# Patient Record
Sex: Female | Born: 1945 | Race: Black or African American | Hispanic: No | Marital: Married | Smoking: Former smoker
Health system: Southern US, Community
[De-identification: ages and names within clinical notes are randomized; demographics above are authoritative.]

## PROBLEM LIST (undated history)

## (undated) DIAGNOSIS — E119 Type 2 diabetes mellitus without complications: Secondary | ICD-10-CM

## (undated) DIAGNOSIS — R011 Cardiac murmur, unspecified: Secondary | ICD-10-CM

## (undated) DIAGNOSIS — I10 Essential (primary) hypertension: Secondary | ICD-10-CM

## (undated) DIAGNOSIS — G2581 Restless legs syndrome: Secondary | ICD-10-CM

## (undated) DIAGNOSIS — Z972 Presence of dental prosthetic device (complete) (partial): Secondary | ICD-10-CM

## (undated) DIAGNOSIS — M109 Gout, unspecified: Secondary | ICD-10-CM

## (undated) HISTORY — PX: BACK SURGERY: SHX140

## (undated) HISTORY — PX: JOINT REPLACEMENT: SHX530

## (undated) HISTORY — PX: CHOLECYSTECTOMY: SHX55

---

## 2004-05-30 ENCOUNTER — Other Ambulatory Visit: Payer: Self-pay

## 2004-11-28 ENCOUNTER — Ambulatory Visit: Payer: Self-pay | Admitting: Internal Medicine

## 2005-02-08 ENCOUNTER — Ambulatory Visit: Payer: Self-pay

## 2005-03-29 ENCOUNTER — Ambulatory Visit: Payer: Self-pay

## 2005-11-08 ENCOUNTER — Inpatient Hospital Stay: Payer: Self-pay | Admitting: Unknown Physician Specialty

## 2006-07-01 ENCOUNTER — Ambulatory Visit: Payer: Self-pay | Admitting: Internal Medicine

## 2007-01-04 IMAGING — CR DG CHEST 2V
1 series · 2 of 2 positions shown · non-contrast
Comparison: none

REASON FOR EXAM: htn
COMMENTS:

[Series 2372: postero_anterior · 0.11mm/px · 2 of 2 slices shown]
[im 1/2]
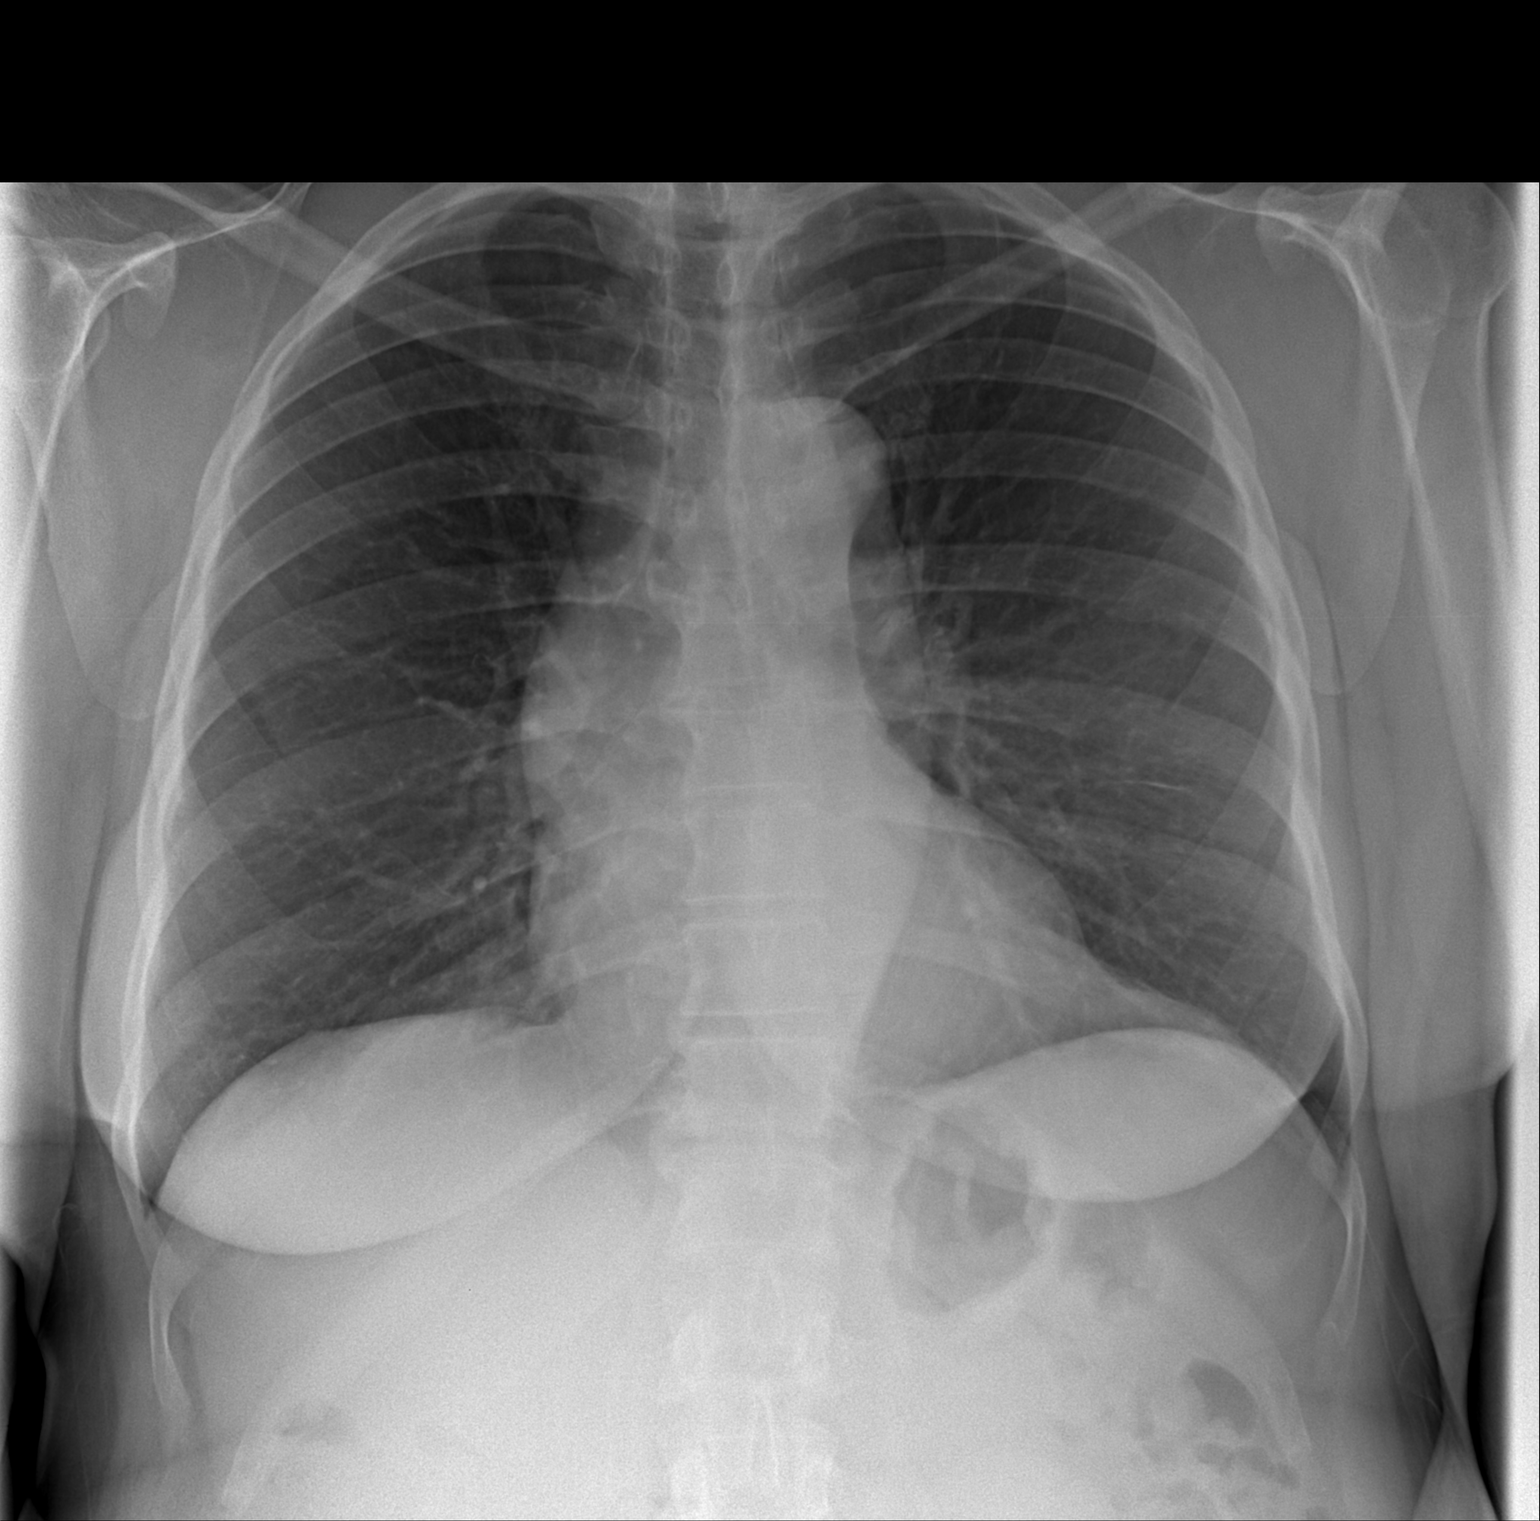
[im 2/2]
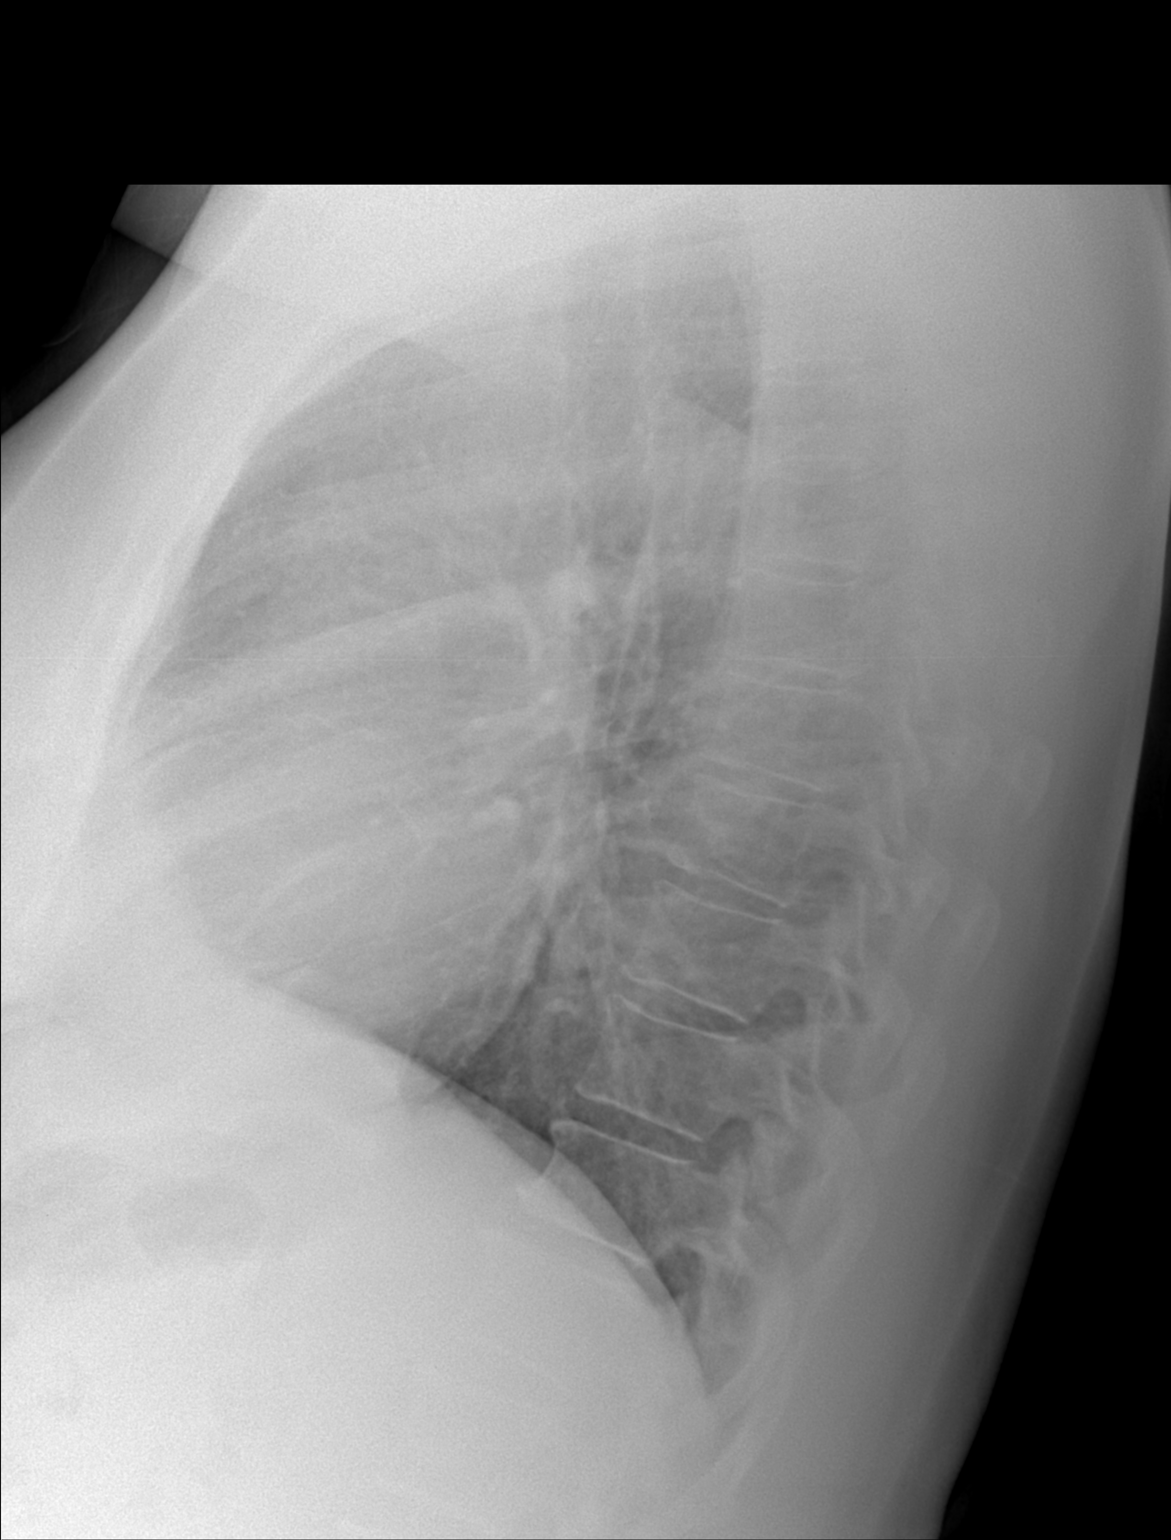

[2 of 2 positions shown; findings below may reference images not displayed]

PROCEDURE:     DXR - DXR CHEST PA (OR AP) AND LATERAL  - October 29, 2005  [DATE]

RESULT:     Comparison is made with PA and lateral chest of 05/30/2004.

The lungs are adequately inflated.  There is no focal infiltrate.  The heart
is not enlarged.  There is tortuosity of the descending and ascending
thoracic aorta.  There is no pleural effusion.
IMPRESSION: I do not see evidence of acute cardiopulmonary disease.

## 2007-08-06 ENCOUNTER — Ambulatory Visit: Payer: Self-pay | Admitting: Internal Medicine

## 2008-08-09 ENCOUNTER — Ambulatory Visit: Payer: Self-pay | Admitting: Internal Medicine

## 2009-09-22 ENCOUNTER — Ambulatory Visit: Payer: Self-pay | Admitting: Internal Medicine

## 2011-01-18 ENCOUNTER — Ambulatory Visit: Payer: Self-pay | Admitting: Internal Medicine

## 2011-01-29 ENCOUNTER — Ambulatory Visit: Payer: Self-pay | Admitting: Internal Medicine

## 2011-04-10 ENCOUNTER — Ambulatory Visit: Payer: Self-pay | Admitting: Internal Medicine

## 2011-05-22 ENCOUNTER — Ambulatory Visit: Payer: Self-pay | Admitting: Unknown Physician Specialty

## 2011-05-22 DIAGNOSIS — I10 Essential (primary) hypertension: Secondary | ICD-10-CM

## 2011-05-29 ENCOUNTER — Inpatient Hospital Stay: Payer: Self-pay | Admitting: Unknown Physician Specialty

## 2011-06-11 ENCOUNTER — Encounter: Payer: Self-pay | Admitting: Unknown Physician Specialty

## 2011-07-11 ENCOUNTER — Encounter: Payer: Self-pay | Admitting: Unknown Physician Specialty

## 2011-08-11 ENCOUNTER — Encounter: Payer: Self-pay | Admitting: Unknown Physician Specialty

## 2011-11-07 ENCOUNTER — Inpatient Hospital Stay: Payer: Self-pay | Admitting: Surgery

## 2012-07-27 IMAGING — CR DG CHEST 2V
1 series · 2 of 2 positions shown · non-contrast
Comparison: none

REASON FOR EXAM: HTN
COMMENTS:

[Series 1: view not recorded · 0.17mm/px · 2 of 2 slices shown]
[im 1/2]
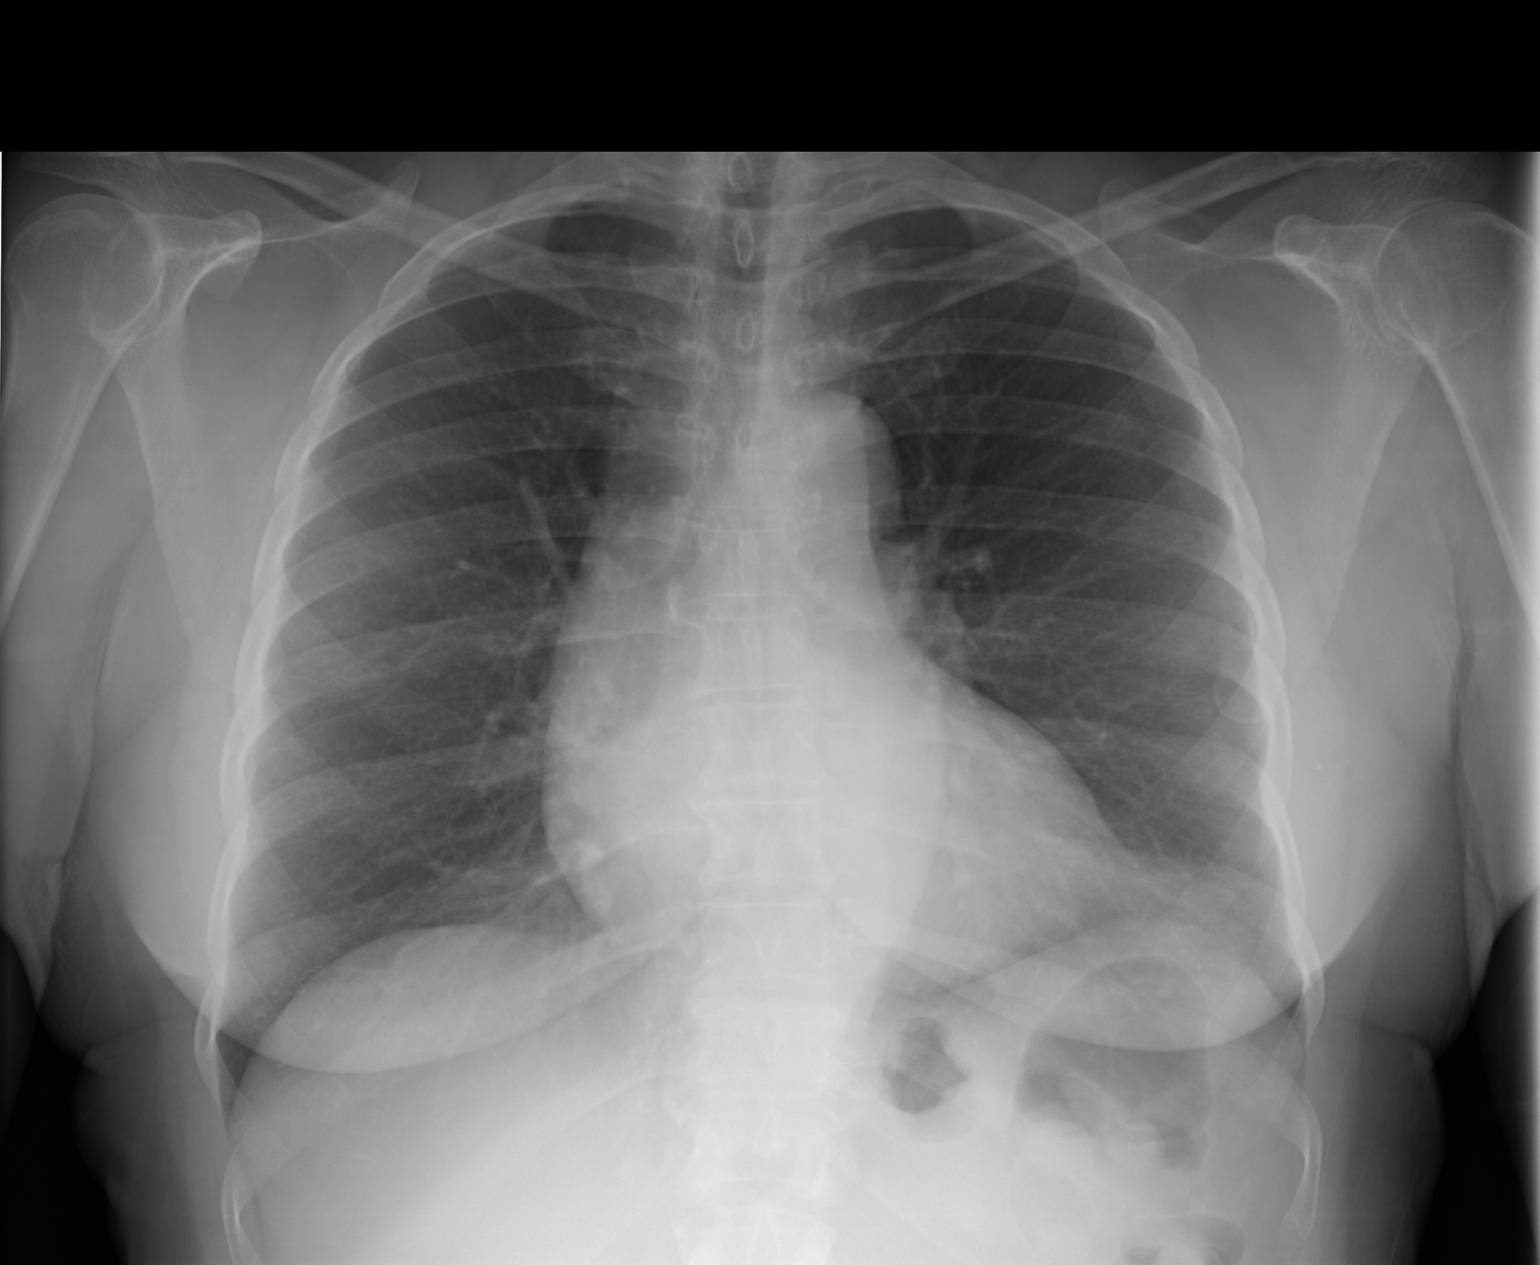
[im 2/2]
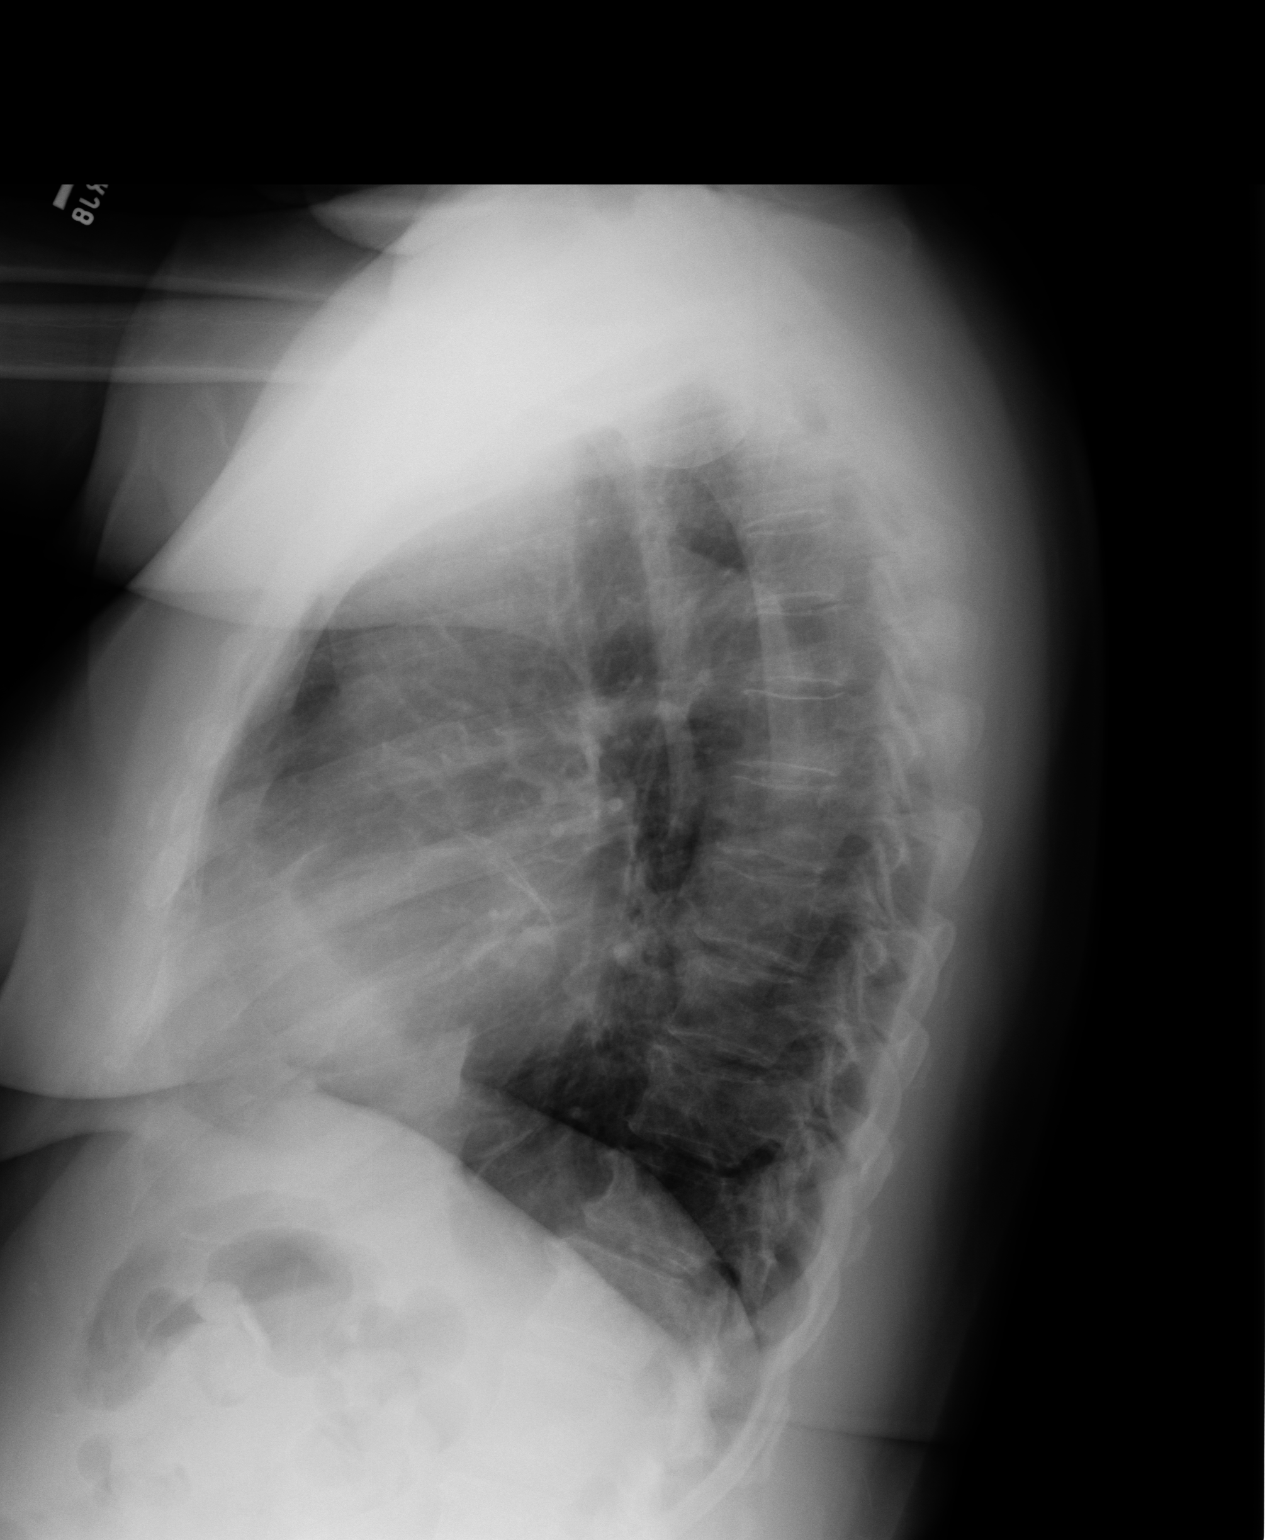

[2 of 2 positions shown; findings below may reference images not displayed]

PROCEDURE:     DXR - DXR CHEST PA (OR AP) AND LATERAL  - May 22, 2011 [DATE]

RESULT:     There is no previous exam for comparison.

The lungs are clear. The heart and pulmonary vessels are normal. The bony
and mediastinal structures are unremarkable. There is no effusion. There is
no pneumothorax or evidence of congestive failure.
IMPRESSION: No acute cardiopulmonary disease.

## 2012-08-19 ENCOUNTER — Ambulatory Visit: Payer: Self-pay | Admitting: Internal Medicine

## 2012-08-21 ENCOUNTER — Ambulatory Visit: Payer: Self-pay | Admitting: Internal Medicine

## 2013-02-16 ENCOUNTER — Ambulatory Visit: Payer: Self-pay | Admitting: Internal Medicine

## 2013-08-20 ENCOUNTER — Ambulatory Visit: Payer: Self-pay | Admitting: Internal Medicine

## 2015-02-03 ENCOUNTER — Ambulatory Visit: Payer: Self-pay | Admitting: Internal Medicine

## 2015-10-27 ENCOUNTER — Encounter: Payer: Self-pay | Admitting: *Deleted

## 2015-11-01 ENCOUNTER — Ambulatory Visit
Admission: RE | Admit: 2015-11-01 | Discharge: 2015-11-01 | Disposition: A | Discharge status: Home / Self Care | Payer: Commercial Managed Care - HMO | Source: Ambulatory Visit | Attending: Ophthalmology | Admitting: Ophthalmology

## 2015-11-01 ENCOUNTER — Ambulatory Visit: Payer: Commercial Managed Care - HMO | Admitting: Certified Registered"

## 2015-11-01 ENCOUNTER — Encounter
Admission: RE | Disposition: A | Discharge status: Home / Self Care | Payer: Self-pay | Source: Ambulatory Visit | Attending: Ophthalmology

## 2015-11-01 DIAGNOSIS — Z96653 Presence of artificial knee joint, bilateral: Secondary | ICD-10-CM | POA: Insufficient documentation

## 2015-11-01 DIAGNOSIS — Z87891 Personal history of nicotine dependence: Secondary | ICD-10-CM | POA: Insufficient documentation

## 2015-11-01 DIAGNOSIS — Z9049 Acquired absence of other specified parts of digestive tract: Secondary | ICD-10-CM | POA: Insufficient documentation

## 2015-11-01 DIAGNOSIS — H2512 Age-related nuclear cataract, left eye: Secondary | ICD-10-CM | POA: Diagnosis present

## 2015-11-01 DIAGNOSIS — E119 Type 2 diabetes mellitus without complications: Secondary | ICD-10-CM | POA: Diagnosis not present

## 2015-11-01 DIAGNOSIS — I10 Essential (primary) hypertension: Secondary | ICD-10-CM | POA: Insufficient documentation

## 2015-11-01 DIAGNOSIS — R011 Cardiac murmur, unspecified: Secondary | ICD-10-CM | POA: Diagnosis not present

## 2015-11-01 DIAGNOSIS — G2581 Restless legs syndrome: Secondary | ICD-10-CM | POA: Diagnosis not present

## 2015-11-01 HISTORY — DX: Type 2 diabetes mellitus without complications: E11.9

## 2015-11-01 HISTORY — DX: Cardiac murmur, unspecified: R01.1

## 2015-11-01 HISTORY — DX: Essential (primary) hypertension: I10

## 2015-11-01 HISTORY — DX: Restless legs syndrome: G25.81

## 2015-11-01 HISTORY — PX: CATARACT EXTRACTION W/PHACO: SHX586

## 2015-11-01 LAB — GLUCOSE, CAPILLARY: GLUCOSE-CAPILLARY: 114 mg/dL — AB (ref 65–99)

## 2015-11-01 SURGERY — PHACOEMULSIFICATION, CATARACT, WITH IOL INSERTION
Anesthesia: Monitor Anesthesia Care | Site: Eye | Laterality: Left | Wound class: Clean

## 2015-11-01 MED ORDER — CARBACHOL 0.01 % IO SOLN
INTRAOCULAR | Status: DC | PRN
  Administered 2015-11-01: .5 mL via INTRAOCULAR

## 2015-11-01 MED ORDER — TETRACAINE HCL 0.5 % OP SOLN
OPHTHALMIC | Status: AC
  Administered 2015-11-01: 1 [drp] via OPHTHALMIC
  Filled 2015-11-01: qty 2

## 2015-11-01 MED ORDER — NA CHONDROIT SULF-NA HYALURON 40-17 MG/ML IO SOLN
INTRAOCULAR | Status: DC | PRN
  Administered 2015-11-01: .5 mL via INTRAOCULAR

## 2015-11-01 MED ORDER — CEFUROXIME OPHTHALMIC INJECTION 1 MG/0.1 ML
INJECTION | OPHTHALMIC | Status: DC | PRN
  Administered 2015-11-01: .1 mL via INTRACAMERAL

## 2015-11-01 MED ORDER — POVIDONE-IODINE 5 % OP SOLN
OPHTHALMIC | Status: AC
  Administered 2015-11-01: 1 via OPHTHALMIC
  Filled 2015-11-01: qty 30

## 2015-11-01 MED ORDER — TETRACAINE HCL 0.5 % OP SOLN
1.0000 [drp] | OPHTHALMIC | Status: AC | PRN
  Administered 2015-11-01: 1 [drp] via OPHTHALMIC

## 2015-11-01 MED ORDER — NA CHONDROIT SULF-NA HYALURON 40-17 MG/ML IO SOLN
INTRAOCULAR | Status: AC
  Filled 2015-11-01: qty 1

## 2015-11-01 MED ORDER — ARMC OPHTHALMIC DILATING GEL
OPHTHALMIC | Status: AC
  Administered 2015-11-01: 1 via OPHTHALMIC
  Filled 2015-11-01: qty 0.25

## 2015-11-01 MED ORDER — EPINEPHRINE HCL 1 MG/ML IJ SOLN
INTRAMUSCULAR | Status: AC
  Filled 2015-11-01: qty 1

## 2015-11-01 MED ORDER — BSS IO SOLN
INTRAOCULAR | Status: DC | PRN
  Administered 2015-11-01: 1 mL via OPHTHALMIC

## 2015-11-01 MED ORDER — MOXIFLOXACIN HCL 0.5 % OP SOLN
OPHTHALMIC | Status: DC | PRN
  Administered 2015-11-01: 1 [drp] via OPHTHALMIC

## 2015-11-01 MED ORDER — SODIUM CHLORIDE 0.9 % IV SOLN
INTRAVENOUS | Status: DC
  Administered 2015-11-01: 06:00:00 via INTRAVENOUS

## 2015-11-01 MED ORDER — CEFUROXIME OPHTHALMIC INJECTION 1 MG/0.1 ML
INJECTION | OPHTHALMIC | Status: AC
  Filled 2015-11-01: qty 0.1

## 2015-11-01 MED ORDER — MIDAZOLAM HCL 2 MG/2ML IJ SOLN
INTRAMUSCULAR | Status: DC | PRN
  Administered 2015-11-01: 2 mg via INTRAVENOUS

## 2015-11-01 MED ORDER — ARMC OPHTHALMIC DILATING GEL
1.0000 "application " | OPHTHALMIC | Status: AC | PRN
  Administered 2015-11-01 (×2): 1 via OPHTHALMIC

## 2015-11-01 MED ORDER — MOXIFLOXACIN HCL 0.5 % OP SOLN
OPHTHALMIC | Status: AC
  Filled 2015-11-01: qty 3

## 2015-11-01 MED ORDER — POVIDONE-IODINE 5 % OP SOLN
1.0000 "application " | OPHTHALMIC | Status: AC | PRN
  Administered 2015-11-01: 1 via OPHTHALMIC

## 2015-11-01 MED ORDER — MOXIFLOXACIN HCL 0.5 % OP SOLN: 1.0000 [drp] | OPHTHALMIC | Status: DC | PRN

## 2015-11-01 SURGICAL SUPPLY — 22 items
CANNULA ANT/CHMB 27GA (MISCELLANEOUS) ×2 IMPLANT
CUP MEDICINE 2OZ PLAST GRAD ST (MISCELLANEOUS) ×2 IMPLANT
GLOVE BIO SURGEON STRL SZ8 (GLOVE) ×2 IMPLANT
GLOVE BIOGEL M 6.5 STRL (GLOVE) ×2 IMPLANT
GLOVE SURG LX 8.0 MICRO (GLOVE) ×1
GLOVE SURG LX STRL 8.0 MICRO (GLOVE) ×1 IMPLANT
GOWN STRL REUS W/ TWL LRG LVL3 (GOWN DISPOSABLE) ×2 IMPLANT
GOWN STRL REUS W/TWL LRG LVL3 (GOWN DISPOSABLE) ×2
LENS IOL TECNIS 20.0 (Intraocular Lens) ×2 IMPLANT
LENS IOL TECNIS MONO 1P 20.0 (Intraocular Lens) ×1 IMPLANT
PACK CATARACT (MISCELLANEOUS) ×2 IMPLANT
PACK CATARACT BRASINGTON LX (MISCELLANEOUS) ×2 IMPLANT
PACK EYE AFTER SURG (MISCELLANEOUS) ×2 IMPLANT
SOL BSS BAG (MISCELLANEOUS) ×2
SOL PREP PVP 2OZ (MISCELLANEOUS) ×2
SOLUTION BSS BAG (MISCELLANEOUS) ×1 IMPLANT
SOLUTION PREP PVP 2OZ (MISCELLANEOUS) ×1 IMPLANT
SYR 3ML LL SCALE MARK (SYRINGE) ×2 IMPLANT
SYR 5ML LL (SYRINGE) ×2 IMPLANT
SYR TB 1ML 27GX1/2 LL (SYRINGE) ×2 IMPLANT
WATER STERILE IRR 1000ML POUR (IV SOLUTION) ×2 IMPLANT
WIPE NON LINTING 3.25X3.25 (MISCELLANEOUS) ×2 IMPLANT

## 2015-11-01 NOTE — Discharge Instructions (Signed)
AMBULATORY SURGERY  °DISCHARGE INSTRUCTIONS ° ° °1) The drugs that you were given will stay in your system until tomorrow so for the next 24 hours you should not: ° °A) Drive an automobile °B) Make any legal decisions °C) Drink any alcoholic beverage ° ° °2) You may resume regular meals tomorrow.  Today it is better to start with liquids and gradually work up to solid foods. ° °You may eat anything you prefer, but it is better to start with liquids, then soup and crackers, and gradually work up to solid foods. ° ° °3) Please notify your doctor immediately if you have any unusual bleeding, trouble breathing, redness and pain at the surgery site, drainage, fever, or pain not relieved by medication. ° ° ° °4) Additional Instructions: ° ° ° °Eye Surgery Discharge Instructions ° °Expect mild scratchy sensation or mild soreness. °DO NOT RUB YOUR EYE! ° °The day of surgery: °• Minimal physical activity, but bed rest is not required °• No reading, computer work, or close hand work °• No bending, lifting, or straining. °• May watch TV ° °For 24 hours: °• No driving, legal decisions, or alcoholic beverages °• Safety precautions °• Eat anything you prefer: It is better to start with liquids, then soup then solid foods. °• _____ Eye patch should be worn until postoperative exam tomorrow. °• ____ Solar shield eyeglasses should be worn for comfort in the sunlight/patch while sleeping ° °Resume all regular medications including aspirin or Coumadin if these were discontinued prior to surgery. °You may shower, bathe, shave, or wash your hair. °Tylenol may be taken for mild discomfort. ° °Call your doctor if you experience significant pain, nausea, or vomiting, fever > 101 or other signs of infection. [phone] or [phone] °Specific instructions: ° ° ° ° ° °Please contact your physician with any problems or Same Day Surgery at [phone], Monday through Friday 6 am to 4 pm, or [place] [facility] number at  [phone]. °

## 2015-11-01 NOTE — Transfer of Care (Signed)
Immediate Anesthesia Transfer of Care Note  Patient: Alicia Tate  Procedure(s) Performed: Procedure(s) with comments: CATARACT EXTRACTION PHACO AND INTRAOCULAR LENS PLACEMENT (IOC) (Left) - US 01:26 AP% 16.5 CDE 14.22 fluid pack lot #[id number]  Patient Location: Short Stay  Anesthesia Type:MAC  Level of Consciousness: awake  Airway & Oxygen Therapy: Patient Spontanous Breathing  Post-op Assessment: Report given to RN  Post vital signs: Reviewed  Last Vitals:  Filed Vitals:   11/01/15 0630 11/01/15 0748  BP: 155/88 122/88  Pulse:  59  Temp:  36.6 C  Resp:      Complications: No apparent anesthesia complications

## 2015-11-01 NOTE — Anesthesia Postprocedure Evaluation (Signed)
Anesthesia Post Note  Patient: Alicia Tate  Procedure(s) Performed: Procedure(s) (LRB): CATARACT EXTRACTION PHACO AND INTRAOCULAR LENS PLACEMENT (IOC) (Left)  Patient location during evaluation: Short Stay Anesthesia Type: MAC Level of consciousness: awake Pain management: pain level controlled Vital Signs Assessment: post-procedure vital signs reviewed and stable Respiratory status: spontaneous breathing Cardiovascular status: stable Postop Assessment: No headache Anesthetic complications: no    Last Vitals:  Filed Vitals:   11/01/15 0630 11/01/15 0748  BP: 155/88 122/88  Pulse:  59  Temp:  36.6 C  Resp:      Last Pain: There were no vitals filed for this visit.               Mathews ArgyleLogan,  Bralen Wiltgen P

## 2015-11-01 NOTE — Anesthesia Preprocedure Evaluation (Signed)
Anesthesia Evaluation  Patient identified by MRN, date of birth, ID band Patient awake    Reviewed: Allergy & Precautions, H&P , NPO status , Patient's Chart, lab work & pertinent test results, reviewed documented beta blocker date and time   Airway Mallampati: II  TM Distance: >3 FB Neck ROM: full    Dental no notable dental hx.    Pulmonary neg pulmonary ROS, former smoker,    Pulmonary exam normal breath sounds clear to auscultation       Cardiovascular Exercise Tolerance: Good hypertension, negative cardio ROS  + Valvular Problems/Murmurs  Rhythm:regular Rate:Normal     Neuro/Psych negative neurological ROS  negative psych ROS   GI/Hepatic negative GI ROS, Neg liver ROS,   Endo/Other  negative endocrine ROSdiabetes  Renal/GU negative Renal ROS  negative genitourinary   Musculoskeletal   Abdominal   Peds  Hematology negative hematology ROS (+)   Anesthesia Other Findings   Reproductive/Obstetrics negative OB ROS                             Anesthesia Physical Anesthesia Plan  ASA: III  Anesthesia Plan: MAC   Post-op Pain Management:    Induction:   Airway Management Planned:   Additional Equipment:   Intra-op Plan:   Post-operative Plan:   Informed Consent: I have reviewed the patients History and Physical, chart, labs and discussed the procedure including the risks, benefits and alternatives for the proposed anesthesia with the patient or authorized representative who has indicated his/her understanding and acceptance.   Dental Advisory Given  Plan Discussed with: CRNA  Anesthesia Plan Comments:         Anesthesia Quick Evaluation

## 2015-11-01 NOTE — Op Note (Signed)
PREOPERATIVE DIAGNOSIS:  Nuclear sclerotic cataract of the left eye.   POSTOPERATIVE DIAGNOSIS:  nuclear sclerotic cataract left eye   OPERATIVE PROCEDURE:  Procedure(s): CATARACT EXTRACTION PHACO AND INTRAOCULAR LENS PLACEMENT (IOC)   SURGEON:  Galen ManilaWilliam Ranon Coven, MD.   ANESTHESIA:   Anesthesiologist: Yevette EdwardsJames G Adams, MD CRNA: Mathews ArgyleBenjamin Logan, CRNA  1.      Managed anesthesia care. 2.      Topical tetracaine drops followed by 2% Xylocaine jelly applied in the preoperative holding area.   COMPLICATIONS:  None.   TECHNIQUE:   Stop and chop   DESCRIPTION OF PROCEDURE:  The patient was examined and consented in the preoperative holding area where the aforementioned topical anesthesia was applied to the left eye and then brought back to the Operating Room where the left eye was prepped and draped in the usual sterile ophthalmic fashion and a lid speculum was placed. A paracentesis was created with the side port blade and the anterior chamber was filled with viscoelastic. A near clear corneal incision was performed with the steel keratome. A continuous curvilinear capsulorrhexis was performed with a cystotome followed by the capsulorrhexis forceps. Hydrodissection and hydrodelineation were carried out with BSS on a blunt cannula. The lens was removed in a stop and chop  technique and the remaining cortical material was removed with the irrigation-aspiration handpiece. The capsular bag was inflated with viscoelastic and the Technis ZCB00 lens was placed in the capsular bag without complication. The remaining viscoelastic was removed from the eye with the irrigation-aspiration handpiece. The wounds were hydrated. The anterior chamber was flushed with Miostat and the eye was inflated to physiologic pressure. 0.1 mL of cefuroxime concentration 10 mg/mL was placed in the anterior chamber. The wounds were found to be water tight. The eye was dressed with Vigamox. The patient was given protective glasses to wear  throughout the day and a shield with which to sleep tonight. The patient was also given drops with which to begin a drop regimen today and will follow-up with me in one day.  Implant Name Type Inv. Item Serial No. Manufacturer Lot No. LRB No. Used  LENS IMPL INTRAOC ZCB00 20.0 - [id number] Intraocular Lens LENS IMPL INTRAOC ZCB00 20.0 [id number] AMO   Left 1   Procedure(s) with comments: CATARACT EXTRACTION PHACO AND INTRAOCULAR LENS PLACEMENT (IOC) (Left) - US 01:26 AP% 16.5 CDE 14.22 fluid pack lot #[id number]  Electronically signed: Mahesh Sizemore LOUIS 11/01/2015 7:46 AM

## 2015-11-01 NOTE — H&P (Signed)
  All labs reviewed. Abnormal studies sent to patients PCP when indicated.  Previous H&P reviewed, patient examined, there are NO CHANGES.  Alicia Tate LOUIS11/22/20167:15 AM

## 2017-02-20 ENCOUNTER — Other Ambulatory Visit: Payer: Self-pay | Admitting: Internal Medicine

## 2017-02-20 DIAGNOSIS — Z1231 Encounter for screening mammogram for malignant neoplasm of breast: Secondary | ICD-10-CM

## 2017-03-26 ENCOUNTER — Ambulatory Visit
Admission: RE | Admit: 2017-03-26 | Discharge: 2017-03-26 | Disposition: A | Discharge status: Home / Self Care | Payer: Commercial Managed Care - HMO | Source: Ambulatory Visit | Attending: Internal Medicine | Admitting: Internal Medicine

## 2017-03-26 DIAGNOSIS — Z1231 Encounter for screening mammogram for malignant neoplasm of breast: Secondary | ICD-10-CM | POA: Diagnosis present

## 2018-06-02 ENCOUNTER — Other Ambulatory Visit: Payer: Self-pay | Admitting: Internal Medicine

## 2018-06-02 DIAGNOSIS — Z1231 Encounter for screening mammogram for malignant neoplasm of breast: Secondary | ICD-10-CM

## 2018-06-23 ENCOUNTER — Ambulatory Visit
Admission: RE | Admit: 2018-06-23 | Discharge: 2018-06-23 | Disposition: A | Discharge status: Home / Self Care | Payer: Medicare PPO | Source: Ambulatory Visit | Attending: Internal Medicine | Admitting: Internal Medicine

## 2018-06-23 DIAGNOSIS — Z1231 Encounter for screening mammogram for malignant neoplasm of breast: Secondary | ICD-10-CM | POA: Insufficient documentation

## 2018-12-30 ENCOUNTER — Other Ambulatory Visit: Payer: Self-pay

## 2018-12-30 ENCOUNTER — Emergency Department
Admission: EM | Admit: 2018-12-30 | Discharge: 2018-12-30 | Disposition: A | Discharge status: Home / Self Care | Payer: Medicare PPO | Attending: Emergency Medicine | Admitting: Emergency Medicine

## 2018-12-30 DIAGNOSIS — R358 Other polyuria: Secondary | ICD-10-CM | POA: Diagnosis present

## 2018-12-30 DIAGNOSIS — E1165 Type 2 diabetes mellitus with hyperglycemia: Secondary | ICD-10-CM | POA: Diagnosis not present

## 2018-12-30 DIAGNOSIS — E119 Type 2 diabetes mellitus without complications: Secondary | ICD-10-CM | POA: Diagnosis not present

## 2018-12-30 DIAGNOSIS — Z87891 Personal history of nicotine dependence: Secondary | ICD-10-CM | POA: Insufficient documentation

## 2018-12-30 DIAGNOSIS — R739 Hyperglycemia, unspecified: Secondary | ICD-10-CM

## 2018-12-30 DIAGNOSIS — Z7984 Long term (current) use of oral hypoglycemic drugs: Secondary | ICD-10-CM | POA: Insufficient documentation

## 2018-12-30 DIAGNOSIS — I10 Essential (primary) hypertension: Secondary | ICD-10-CM | POA: Insufficient documentation

## 2018-12-30 DIAGNOSIS — Z79899 Other long term (current) drug therapy: Secondary | ICD-10-CM | POA: Insufficient documentation

## 2018-12-30 LAB — URINALYSIS, COMPLETE (UACMP) WITH MICROSCOPIC
BACTERIA UA: NONE SEEN
BILIRUBIN URINE: NEGATIVE
Glucose, UA: >=500 mg/dL — AB
HGB URINE DIPSTICK: NEGATIVE
Ketones, ur: NEGATIVE mg/dL
NITRITE: NEGATIVE
PH: 5 (ref 5.0–8.0)
Protein, ur: NEGATIVE mg/dL
SPECIFIC GRAVITY, URINE: 1.031 — AB (ref 1.005–1.030)

## 2018-12-30 LAB — BASIC METABOLIC PANEL
Anion gap: 11 (ref 5–15)
BUN: 17 mg/dL (ref 8–23)
CALCIUM: 9.8 mg/dL (ref 8.9–10.3)
CHLORIDE: 95 mmol/L — AB (ref 98–111)
CO2: 26 mmol/L (ref 22–32)
Creatinine, Ser: 1.19 mg/dL — ABNORMAL HIGH (ref 0.44–1.00)
GFR, EST AFRICAN AMERICAN: 53 mL/min — AB (ref >60)
GFR, EST NON AFRICAN AMERICAN: 46 mL/min — AB (ref >60)
Glucose, Bld: 600 mg/dL (ref 70–99)
Potassium: 3.8 mmol/L (ref 3.5–5.1)
SODIUM: 132 mmol/L — AB (ref 135–145)

## 2018-12-30 LAB — CBC
HCT: 44.9 % (ref 36.0–46.0)
Hemoglobin: 14.6 g/dL (ref 12.0–15.0)
MCH: 23 pg — ABNORMAL LOW (ref 26.0–34.0)
MCHC: 32.5 g/dL (ref 30.0–36.0)
MCV: 70.7 fL — ABNORMAL LOW (ref 80.0–100.0)
NRBC: 0 % (ref 0.0–0.2)
PLATELETS: 285 10*3/uL (ref 150–400)
RBC: 6.35 MIL/uL — ABNORMAL HIGH (ref 3.87–5.11)
RDW: 13.8 % (ref 11.5–15.5)
WBC: 8.1 10*3/uL (ref 4.0–10.5)

## 2018-12-30 LAB — GLUCOSE, CAPILLARY
GLUCOSE-CAPILLARY: 569 mg/dL — AB (ref 70–99)
Glucose-Capillary: 439 mg/dL — ABNORMAL HIGH (ref 70–99)
Glucose-Capillary: 281 mg/dL — ABNORMAL HIGH (ref 70–99)

## 2018-12-30 MED ORDER — SODIUM CHLORIDE 0.9 % IV BOLUS
1000.0000 mL | Freq: Once | INTRAVENOUS | Status: AC
  Administered 2018-12-30: 1000 mL via INTRAVENOUS

## 2018-12-30 MED ORDER — INSULIN ASPART 100 UNIT/ML ~~LOC~~ SOLN
8.0000 [IU] | Freq: Once | SUBCUTANEOUS | Status: AC
  Administered 2018-12-30: 8 [IU] via INTRAVENOUS
  Filled 2018-12-30: qty 1
  Filled 2018-12-30: qty 0.08

## 2018-12-30 NOTE — ED Notes (Signed)
Pt ambulatory to treatment room. No distress noted.  

## 2018-12-30 NOTE — ED Triage Notes (Signed)
Pt here for hyperglycemia. On metformin. Denies any symptoms of hyperglycemia. Pt states she drank orange juice all weekend. Here because CBG was 579.   A&O, ambulatory. No distress noted.

## 2018-12-30 NOTE — ED Provider Notes (Signed)
[facility] Emergency Department Provider Note   First MD Initiated Contact with Patient 12/30/18 1932     (approximate)  I have reviewed the triage vital signs and the nursing notes.   HISTORY  Chief Complaint Hyperglycemia    HPI Alicia Tate is a 73 y.o. female with history of diabetes mellitus presents to the emergency department with elevated glucose patient states that she has had intense thirst and polyuria.  Patient states that she has been drinking Sunny D proximately 1 gallon this weekend.  Patient states her glucose at home read high and as a result she took 1000 mg of metformin before arrival.  Has no fever no cough.  Patient denies any urinary symptoms.  Patient denies any abdominal discomfort.   Past Medical History:  Diagnosis Date  . Diabetes mellitus without complication (HCC)   . Heart murmur   . Hypertension   . RLS (restless legs syndrome)     There are no active problems to display for this patient.   Past Surgical History:  Procedure Laterality Date  . BACK SURGERY    . CATARACT EXTRACTION W/PHACO Left 11/01/2015   Procedure: CATARACT EXTRACTION PHACO AND INTRAOCULAR LENS PLACEMENT (IOC);  Surgeon: Galen Manila, MD;  Location: [facility] ORS;  Service: Ophthalmology;  Laterality: Left;  [place] 01:26 AP% 16.5 CDE 14.22 fluid pack lot #[id number] H  . CHOLECYSTECTOMY    . JOINT REPLACEMENT     bil tkr    Prior to Admission medications   Medication Sig Start Date End Date Taking? Authorizing Provider  allopurinol (ZYLOPRIM) 300 MG tablet Take 300 mg by mouth daily.    [provider]  amLODipine (NORVASC) 10 MG tablet Take 10 mg by mouth daily.    [provider]  atenolol (TENORMIN) 100 MG tablet Take 100 mg by mouth daily.    [provider]  lisinopril (PRINIVIL,ZESTRIL) 40 MG tablet Take 40 mg by mouth daily.    [provider]  metFORMIN (GLUCOPHAGE) 500 MG tablet Take by mouth 2 (two)  times daily with a meal.    [provider]    Allergies Patient has no known allergies.  Family History  Problem Relation Age of Onset  . Breast cancer Sister 1    Social History Social History   Tobacco Use  . Smoking status: Former Smoker  Substance Use Topics  . Alcohol use: No  . Drug use: Not on file    Review of Systems Constitutional: No fever/chills Eyes: No visual changes. ENT: No sore throat. Cardiovascular: Denies chest pain. Respiratory: Denies shortness of breath. Gastrointestinal: No abdominal pain.  No nausea, no vomiting.  No diarrhea.  No constipation. Genitourinary: Negative for dysuria. Musculoskeletal: Negative for neck pain.  Negative for back pain. Integumentary: Negative for rash. Neurological: Negative for headaches, focal weakness or numbness. Endocrine:That of hyperglycemia   ____________________________________________   PHYSICAL EXAM:  VITAL SIGNS: ED Triage Vitals [12/30/18 1706]  Enc Vitals Group     BP (!) 152/94     Pulse Rate 84     Resp 18     Temp 98.1 F (36.7 C)     Temp src      SpO2 99 %     Weight 71.2 kg (157 lb)     Height 1.626 m (5\' 4" )     Head Circumference      Peak Flow      Pain Score 0     Pain Loc  Pain Edu?      Excl. in GC?     Constitutional: Alert and oriented. Well appearing and in no acute distress. Eyes: Conjunctivae are normal.  Mouth/Throat: Mucous membranes are moist.  Oropharynx non-erythematous. Neck: No stridor.  Cardiovascular: Normal rate, regular rhythm. Good peripheral circulation. Grossly normal heart sounds. Respiratory: Normal respiratory effort.  No retractions. Lungs CTAB. Gastrointestinal: Soft and nontender. No distention.  Musculoskeletal: No lower extremity tenderness nor edema. No gross deformities of extremities. Neurologic:  Normal speech and language. No gross focal neurologic deficits are appreciated.  Skin:  Skin is warm, dry and intact. No rash  noted. Psychiatric: Mood and affect are normal. Speech and behavior are normal.  ____________________________________________   LABS (all labs ordered are listed, but only abnormal results are displayed)  Labs Reviewed  BASIC METABOLIC PANEL - Abnormal; Notable for the following components:      Result Value   Sodium 132 (*)    Chloride 95 (*)    Glucose, Bld 600 (*)    Creatinine, Ser 1.19 (*)    GFR calc non Af Amer 46 (*)    GFR calc Af Amer 53 (*)    All other components within normal limits  CBC - Abnormal; Notable for the following components:   RBC 6.35 (*)    MCV 70.7 (*)    MCH 23.0 (*)    All other components within normal limits  URINALYSIS, COMPLETE (UACMP) WITH MICROSCOPIC - Abnormal; Notable for the following components:   Color, Urine YELLOW (*)    APPearance HAZY (*)    Specific Gravity, Urine 1.031 (*)    Glucose, UA >=500 (*)    Leukocytes, UA TRACE (*)    All other components within normal limits  GLUCOSE, CAPILLARY - Abnormal; Notable for the following components:   Glucose-Capillary 569 (*)    All other components within normal limits  GLUCOSE, CAPILLARY - Abnormal; Notable for the following components:   Glucose-Capillary 439 (*)    All other components within normal limits  GLUCOSE, CAPILLARY - Abnormal; Notable for the following components:   Glucose-Capillary 281 (*)    All other components within normal limits  CBG MONITORING, ED    Procedures   ____________________________________________   INITIAL IMPRESSION / ASSESSMENT AND PLAN / ED COURSE  As part of my medical decision making, I reviewed the following data within the electronic MEDICAL RECORD NUMBER  73 year old female presented with above-stated history and physical exam secondary to hyperglycemia.  Patient's anion gap normal as a result the patient given 2 L IV normal saline and 8 units IV insulin with repeat glucose 281 at present.  Spoke with the patient at length regarding diabetes  management at home necessity of appropriate dietary intake patient states that she does not drink water and mostly drinks [facility].  Patient referred to primary care provider for further outpatient management.  ____________________________________________  FINAL CLINICAL IMPRESSION(S) / ED DIAGNOSES  Final diagnoses:  Hyperglycemia     MEDICATIONS GIVEN DURING THIS VISIT:  Medications  sodium chloride 0.9 % bolus 1,000 mL (0 mLs Intravenous Stopped 12/30/18 1817)  sodium chloride 0.9 % bolus 1,000 mL (0 mLs Intravenous Stopped 12/30/18 2029)  insulin aspart (novoLOG) injection 8 Units (8 Units Intravenous Given 12/30/18 1936)     ED Discharge Orders    None       Note:  This document was prepared using Dragon voice recognition software and may include unintentional dictation errors.    Bayard MalesBrown, [place]  Dorris Carnes, MD 12/30/18 2153

## 2018-12-30 NOTE — ED Triage Notes (Signed)
FIRST NURSE NOTE-sent for blood sugar over 500. Pt ambulatory without difficulty.  NAD.

## 2019-08-20 ENCOUNTER — Other Ambulatory Visit: Payer: Self-pay | Admitting: Internal Medicine

## 2019-08-20 DIAGNOSIS — Z1231 Encounter for screening mammogram for malignant neoplasm of breast: Secondary | ICD-10-CM

## 2019-09-28 ENCOUNTER — Ambulatory Visit
Admission: RE | Admit: 2019-09-28 | Discharge: 2019-09-28 | Disposition: A | Discharge status: Home / Self Care | Payer: Medicare PPO | Source: Ambulatory Visit | Attending: Internal Medicine | Admitting: Internal Medicine

## 2019-09-28 DIAGNOSIS — Z1231 Encounter for screening mammogram for malignant neoplasm of breast: Secondary | ICD-10-CM | POA: Insufficient documentation

## 2020-09-19 ENCOUNTER — Other Ambulatory Visit: Payer: Self-pay | Admitting: Internal Medicine

## 2020-09-19 DIAGNOSIS — Z1231 Encounter for screening mammogram for malignant neoplasm of breast: Secondary | ICD-10-CM

## 2020-12-03 IMAGING — MG DIGITAL SCREENING BILAT W/ TOMO W/ CAD
6 of 10 series · 6 of 30 positions shown · non-contrast
Comparison: Previous exam(s).

CLINICAL DATA: Screening.

EXAM:
DIGITAL SCREENING BILATERAL MAMMOGRAM WITH TOMO AND CAD

[L MLO synth-2D (1 of 2)]
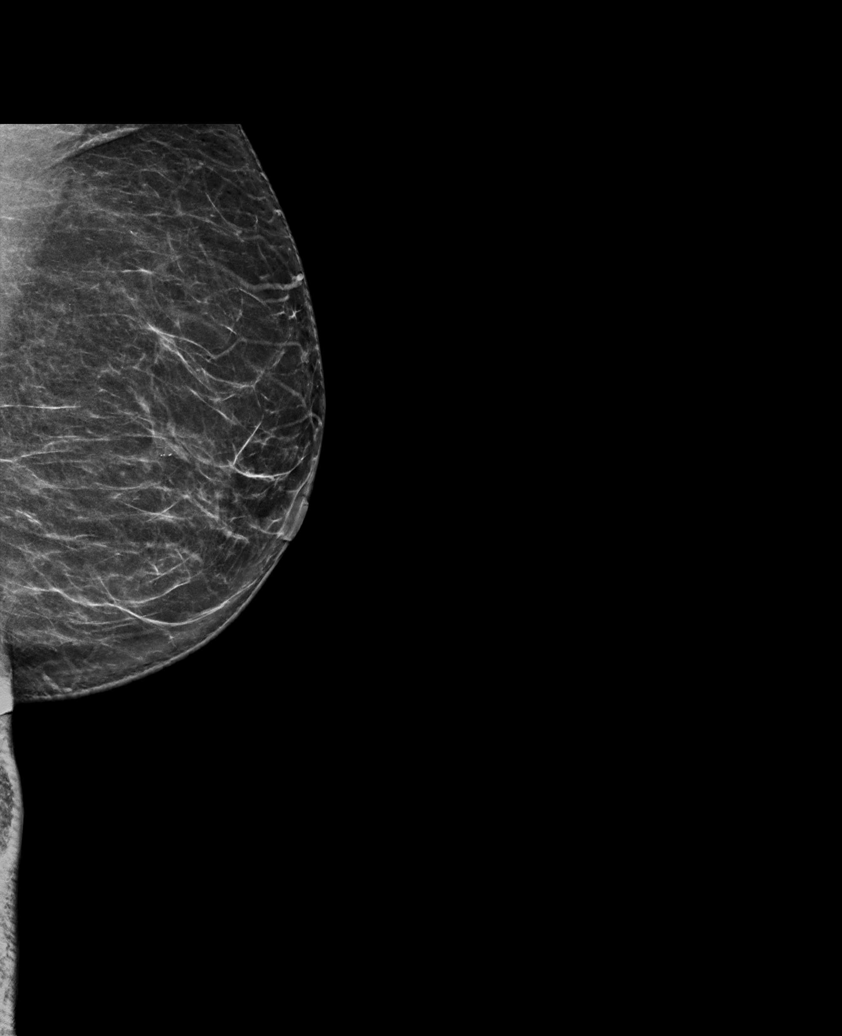

[R CC synth-2D]
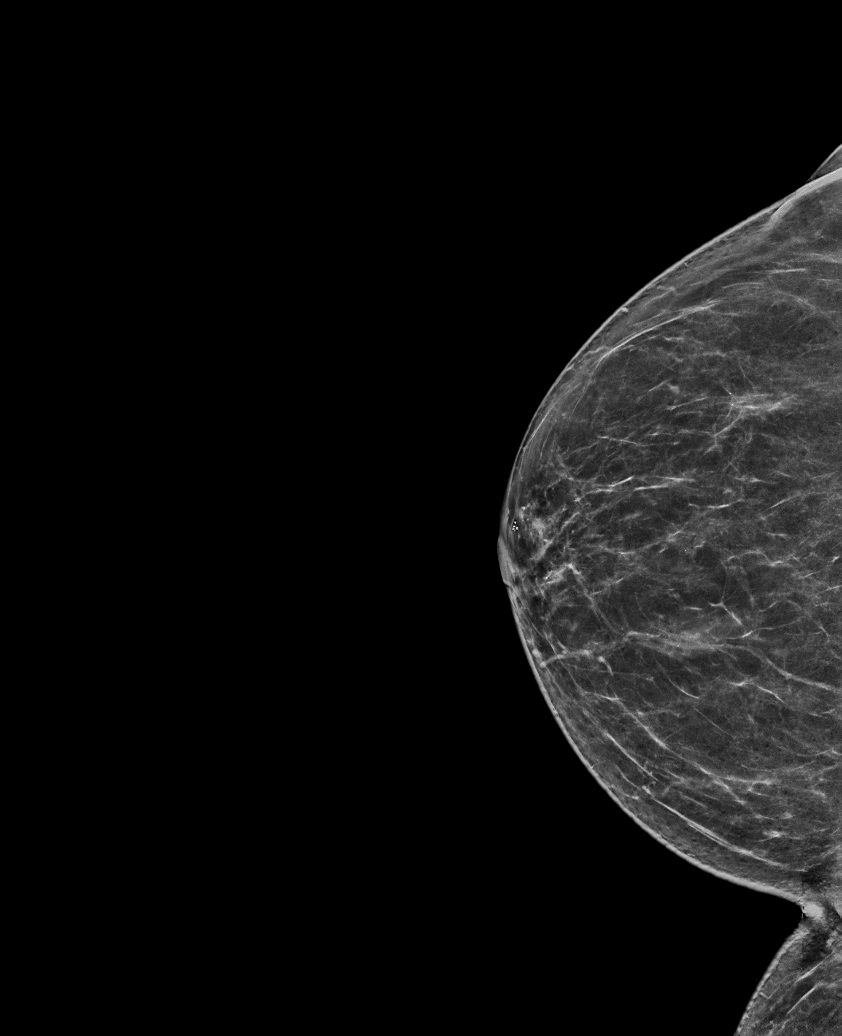

[L CC synth-2D]
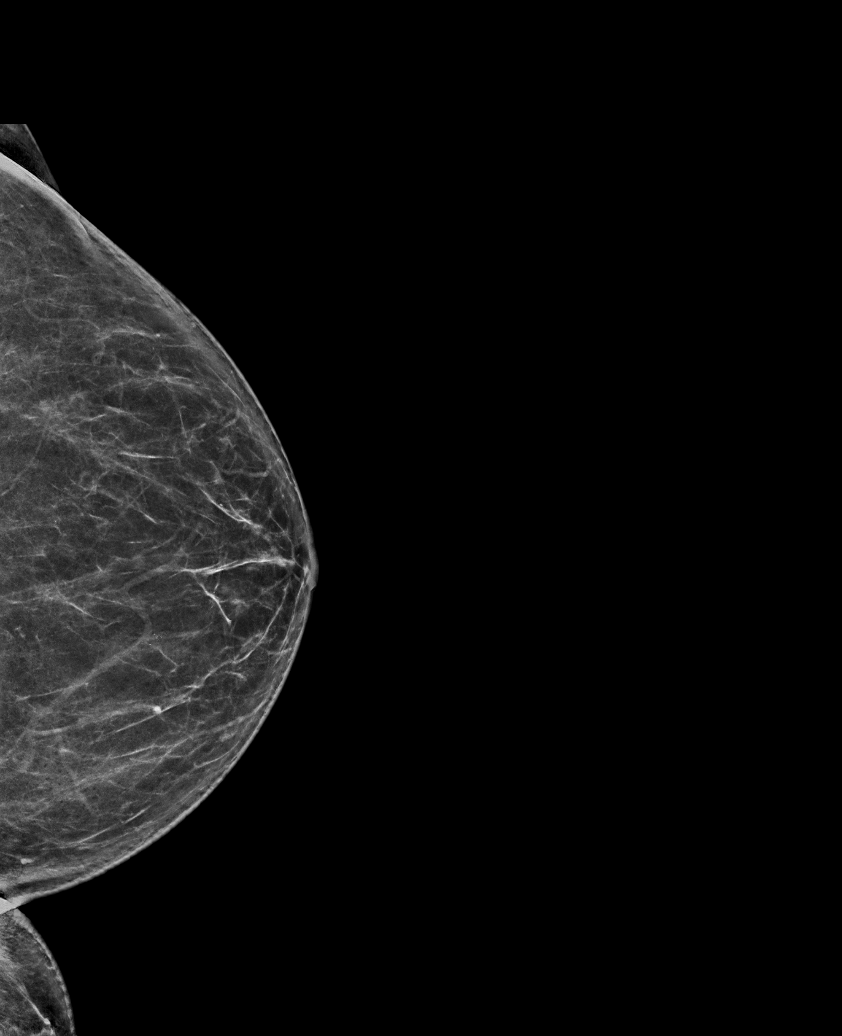

[L MLO synth-2D (2 of 2)]
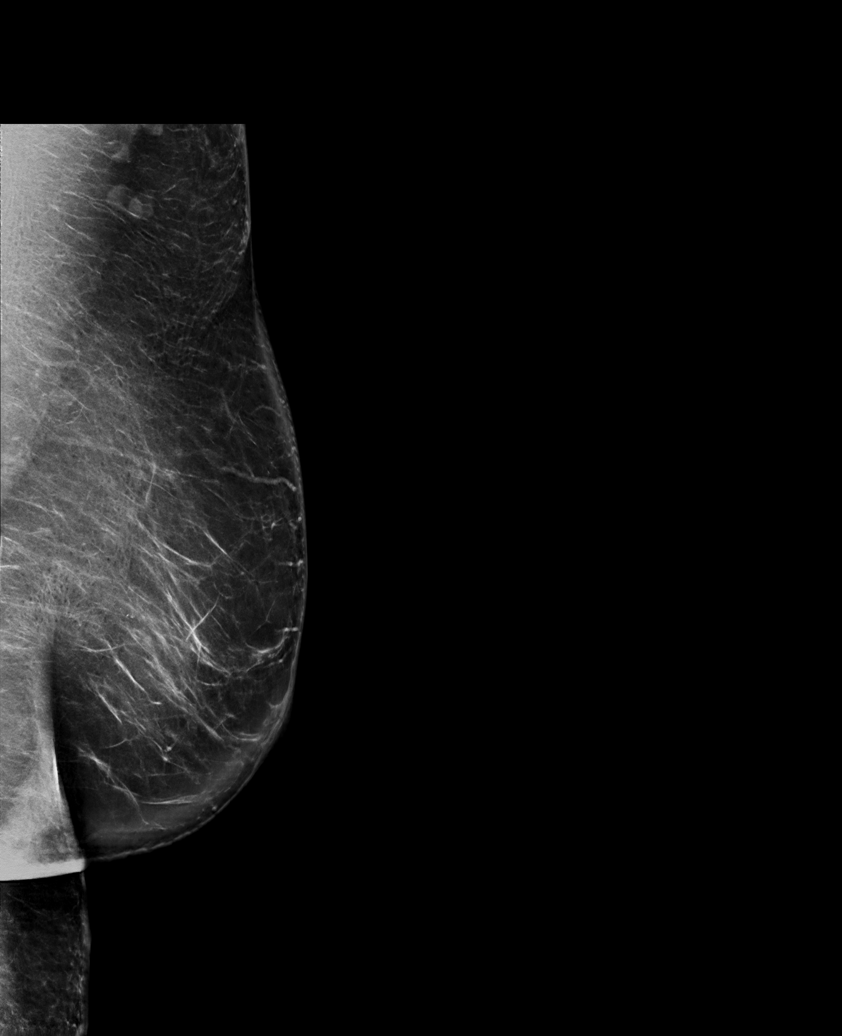

[R MLO synth-2D]
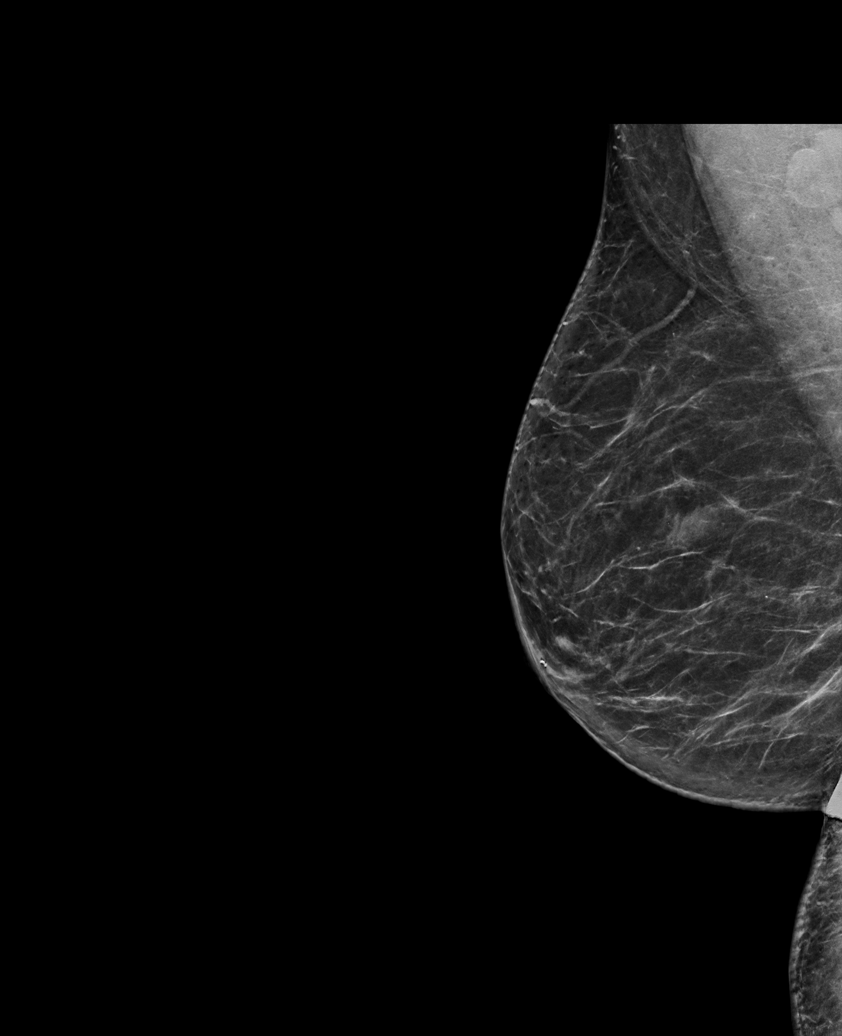

[L MLO tomo · tomo slice 53/105.0]
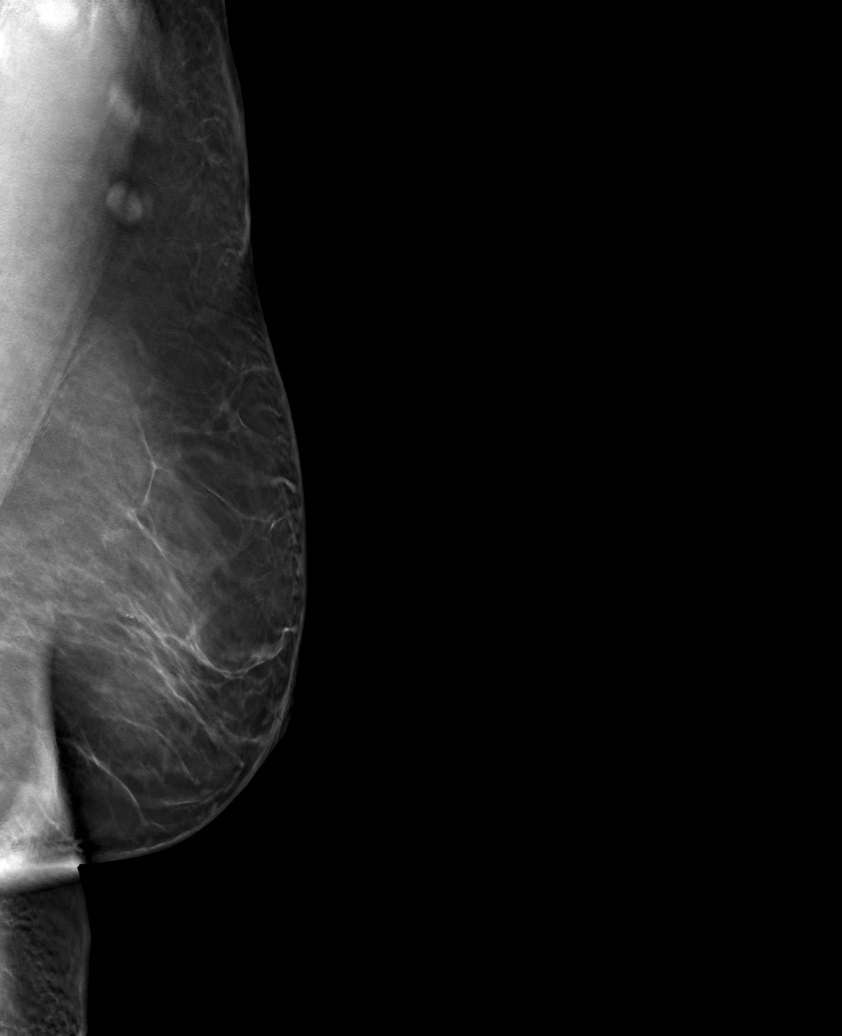

[6 of 30 positions shown; findings below may reference images not displayed]

ACR Breast Density Category b: There are scattered areas of
fibroglandular density.
FINDINGS: There are no findings suspicious for malignancy. Images were
processed with CAD.
IMPRESSION: No mammographic evidence of malignancy. A result letter of this
screening mammogram will be mailed directly to the patient.

RECOMMENDATION:
Screening mammogram in one year. (Code:[id number])

BI-RADS CATEGORY  1: Negative.

## 2023-04-24 ENCOUNTER — Encounter: Payer: Self-pay | Admitting: Ophthalmology

## 2023-04-24 NOTE — Anesthesia Preprocedure Evaluation (Addendum)
Anesthesia Evaluation  Patient identified by MRN, date of birth, ID band Patient awake    Reviewed: Allergy & Precautions, H&P , NPO status , Patient's Chart, lab work & pertinent test results  Airway Mallampati: III  TM Distance: <3 FB Neck ROM: Full    Dental no notable dental hx. (+) Upper Dentures   Pulmonary former smoker   Pulmonary exam normal breath sounds clear to auscultation       Cardiovascular hypertension, + Valvular Problems/Murmurs  Rhythm:Regular Rate:Normal + Systolic murmurs Echo 10-17-22 EF 55%, mild TR, mild asymetric LVH, Systolic heart murmur   Neuro/Psych negative neurological ROS  negative psych ROS   GI/Hepatic negative GI ROS, Neg liver ROS,,,  Endo/Other  diabetes    Renal/GU negative Renal ROS  negative genitourinary   Musculoskeletal negative musculoskeletal ROS (+)    Abdominal   Peds negative pediatric ROS (+)  Hematology negative hematology ROS (+)   Anesthesia Other Findings Hypertension Heart murmur  RLS (restless legs syndrome) Diabetes mellitus without complication (HCC)  Wears dentures    Reproductive/Obstetrics negative OB ROS                             Anesthesia Physical Anesthesia Plan  ASA: 3  Anesthesia Plan: MAC   Post-op Pain Management:    Induction: Intravenous  PONV Risk Score and Plan:   Airway Management Planned: Natural Airway and Nasal Cannula  Additional Equipment:   Intra-op Plan:   Post-operative Plan:   Informed Consent: I have reviewed the patients History and Physical, chart, labs and discussed the procedure including the risks, benefits and alternatives for the proposed anesthesia with the patient or authorized representative who has indicated his/her understanding and acceptance.     Dental Advisory Given  Plan Discussed with: Anesthesiologist, CRNA and Surgeon  Anesthesia Plan Comments: (Patient  consented for risks of anesthesia including but not limited to:  - adverse reactions to medications - damage to eyes, teeth, lips or other oral mucosa - nerve damage due to positioning  - sore throat or hoarseness - Damage to heart, brain, nerves, lungs, other parts of body or loss of life  Patient voiced understanding.)        Anesthesia Quick Evaluation

## 2023-04-26 NOTE — Discharge Instructions (Signed)

## 2023-04-30 ENCOUNTER — Encounter: Payer: Self-pay | Admitting: Ophthalmology

## 2023-04-30 ENCOUNTER — Ambulatory Visit: Payer: Medicare PPO | Admitting: Anesthesiology

## 2023-04-30 ENCOUNTER — Other Ambulatory Visit: Payer: Self-pay

## 2023-04-30 ENCOUNTER — Ambulatory Visit
Admission: RE | Admit: 2023-04-30 | Discharge: 2023-04-30 | Disposition: A | Discharge status: Home / Self Care | Payer: Medicare PPO | Source: Ambulatory Visit | Attending: Ophthalmology | Admitting: Ophthalmology

## 2023-04-30 ENCOUNTER — Encounter
Admission: RE | Disposition: A | Discharge status: Home / Self Care | Payer: Self-pay | Source: Ambulatory Visit | Attending: Ophthalmology

## 2023-04-30 DIAGNOSIS — Z7984 Long term (current) use of oral hypoglycemic drugs: Secondary | ICD-10-CM | POA: Diagnosis not present

## 2023-04-30 DIAGNOSIS — I10 Essential (primary) hypertension: Secondary | ICD-10-CM | POA: Insufficient documentation

## 2023-04-30 DIAGNOSIS — H2511 Age-related nuclear cataract, right eye: Secondary | ICD-10-CM | POA: Diagnosis present

## 2023-04-30 DIAGNOSIS — E1136 Type 2 diabetes mellitus with diabetic cataract: Secondary | ICD-10-CM | POA: Insufficient documentation

## 2023-04-30 DIAGNOSIS — Z79899 Other long term (current) drug therapy: Secondary | ICD-10-CM | POA: Insufficient documentation

## 2023-04-30 DIAGNOSIS — Z87891 Personal history of nicotine dependence: Secondary | ICD-10-CM | POA: Diagnosis not present

## 2023-04-30 HISTORY — DX: Presence of dental prosthetic device (complete) (partial): Z97.2

## 2023-04-30 HISTORY — DX: Gout, unspecified: M10.9

## 2023-04-30 HISTORY — PX: CATARACT EXTRACTION W/PHACO: SHX586

## 2023-04-30 LAB — GLUCOSE, CAPILLARY: Glucose-Capillary: 121 mg/dL — ABNORMAL HIGH (ref 70–99)

## 2023-04-30 SURGERY — PHACOEMULSIFICATION, CATARACT, WITH IOL INSERTION
Anesthesia: Monitor Anesthesia Care | Site: Eye | Laterality: Right

## 2023-04-30 MED ORDER — SIGHTPATH DOSE#1 NA CHONDROIT SULF-NA HYALURON 40-17 MG/ML IO SOLN
INTRAOCULAR | Status: DC | PRN
  Administered 2023-04-30: 1 mL via INTRAOCULAR

## 2023-04-30 MED ORDER — ARMC OPHTHALMIC DILATING DROPS
1.0000 | OPHTHALMIC | Status: DC | PRN
  Administered 2023-04-30 (×3): 1 via OPHTHALMIC

## 2023-04-30 MED ORDER — LACTATED RINGERS IV SOLN: INTRAVENOUS | Status: DC

## 2023-04-30 MED ORDER — BRIMONIDINE TARTRATE-TIMOLOL 0.2-0.5 % OP SOLN
OPHTHALMIC | Status: DC | PRN
  Administered 2023-04-30: 1 [drp] via OPHTHALMIC

## 2023-04-30 MED ORDER — SIGHTPATH DOSE#1 BSS IO SOLN
INTRAOCULAR | Status: DC | PRN
  Administered 2023-04-30: 2 mL

## 2023-04-30 MED ORDER — SIGHTPATH DOSE#1 BSS IO SOLN
INTRAOCULAR | Status: DC | PRN
  Administered 2023-04-30: 47 mL via OPHTHALMIC

## 2023-04-30 MED ORDER — MOXIFLOXACIN HCL 0.5 % OP SOLN
OPHTHALMIC | Status: DC | PRN
  Administered 2023-04-30: .2 mL via OPHTHALMIC

## 2023-04-30 MED ORDER — SIGHTPATH DOSE#1 BSS IO SOLN
INTRAOCULAR | Status: DC | PRN
  Administered 2023-04-30: 15 mL via INTRAOCULAR

## 2023-04-30 MED ORDER — MIDAZOLAM HCL 2 MG/2ML IJ SOLN
INTRAMUSCULAR | Status: DC | PRN
  Administered 2023-04-30: 2 mg via INTRAVENOUS

## 2023-04-30 MED ORDER — TETRACAINE HCL 0.5 % OP SOLN
1.0000 [drp] | OPHTHALMIC | Status: DC | PRN
  Administered 2023-04-30 (×3): 1 [drp] via OPHTHALMIC

## 2023-04-30 SURGICAL SUPPLY — 11 items
ANGLE REVERSE CUT SHRT 25GA (CUTTER) ×1
CATARACT SUITE SIGHTPATH (MISCELLANEOUS) ×1 IMPLANT
CYSTOTOME ANGL RVRS SHRT 25G (CUTTER) ×1 IMPLANT
CYSTOTOME ANGL RVRS SHRT 25GA (CUTTER) ×1 IMPLANT
FEE CATARACT SUITE SIGHTPATH (MISCELLANEOUS) ×1 IMPLANT
GLOVE BIOGEL PI IND STRL 8 (GLOVE) ×1 IMPLANT
GLOVE SURG ENC TEXT LTX SZ8 (GLOVE) ×1 IMPLANT
LENS IOL TECNIS EYHANCE 20.0 (Intraocular Lens) IMPLANT
NDL FILTER BLUNT 18X1 1/2 (NEEDLE) ×1 IMPLANT
NEEDLE FILTER BLUNT 18X1 1/2 (NEEDLE) ×1 IMPLANT
SYR 3ML LL SCALE MARK (SYRINGE) ×1 IMPLANT

## 2023-04-30 NOTE — Anesthesia Postprocedure Evaluation (Signed)
Anesthesia Post Note  Patient: Alicia Tate  Procedure(s) Performed: CATARACT EXTRACTION PHACO AND INTRAOCULAR LENS PLACEMENT (IOC) RIGHT DIABETIC 9.96 01:00.6 (Right: Eye)  Patient location during evaluation: PACU Anesthesia Type: MAC Level of consciousness: awake and alert Pain management: pain level controlled Vital Signs Assessment: post-procedure vital signs reviewed and stable Respiratory status: spontaneous breathing, nonlabored ventilation, respiratory function stable and patient connected to nasal cannula oxygen Cardiovascular status: stable and blood pressure returned to baseline Postop Assessment: no apparent nausea or vomiting Anesthetic complications: no   No notable events documented.   Last Vitals:  Vitals:   04/30/23 1050 04/30/23 1055  BP: 128/84 135/86  Pulse: (!) 56 (!) 55  Resp: 19 20  Temp: (!) 36.1 C (!) 36.1 C  SpO2: 95% 94%    Last Pain:  Vitals:   04/30/23 1055  PainSc: 0-No pain                 Scheryl Sanborn C Noam Franzen

## 2023-04-30 NOTE — H&P (Signed)
[facility]   Primary Care Physician:  Barbette Reichmann, MD Ophthalmologist: Dr. Maren Reamer  Pre-Procedure History & Physical: HPI:  Alicia Tate is a 77 y.o. female here for cataract surgery.   Past Medical History:  Diagnosis Date   Diabetes mellitus without complication (HCC)    Gout    Heart murmur    Hypertension    RLS (restless legs syndrome)    Wears dentures    full upper    Past Surgical History:  Procedure Laterality Date   BACK SURGERY     CATARACT EXTRACTION W/PHACO Left 11/01/2015   Procedure: CATARACT EXTRACTION PHACO AND INTRAOCULAR LENS PLACEMENT (IOC);  Surgeon: Galen Manila, MD;  Location: [facility] ORS;  Service: Ophthalmology;  Laterality: Left;  [place] 01:26 AP% 16.5 CDE 14.22 fluid pack lot #[id number] H   CHOLECYSTECTOMY     JOINT REPLACEMENT     bil tkr    Prior to Admission medications   Medication Sig Start Date End Date Taking? Authorizing Provider  allopurinol (ZYLOPRIM) 300 MG tablet Take 300 mg by mouth daily.   Yes [provider]  amLODipine (NORVASC) 10 MG tablet Take 10 mg by mouth daily.   Yes [provider]  atenolol (TENORMIN) 100 MG tablet Take 100 mg by mouth daily.   Yes [provider]  glimepiride (AMARYL) 4 MG tablet Take 4 mg by mouth daily with breakfast.   Yes [provider]  lisinopril (PRINIVIL,ZESTRIL) 40 MG tablet Take 40 mg by mouth daily.   Yes [provider]  metFORMIN (GLUCOPHAGE) 500 MG tablet Take 1,000 mg by mouth 2 (two) times daily with a meal.   Yes [provider]  hydrOXYzine (ATARAX) 10 MG tablet Take 10 mg by mouth at bedtime as needed. Patient not taking: Reported on 04/24/2023    [provider]    Allergies as of 04/08/2023   (No Known Allergies)    Family History  Problem Relation Age of Onset   Breast cancer Sister 11    Social History   Socioeconomic History   Marital status: Married    Spouse name: Not on file   Number of  children: Not on file   Years of education: Not on file   Highest education level: Not on file  Occupational History   Not on file  Tobacco Use   Smoking status: Former    Types: Cigarettes    Quit date: 1990    Years since quitting: 34.4   Smokeless tobacco: Never  Vaping Use   Vaping Use: Never used  Substance and Sexual Activity   Alcohol use: No   Drug use: Not on file   Sexual activity: Not on file  Other Topics Concern   Not on file  Social History Narrative   Not on file   Social Determinants of Health   Financial Resource Strain: Not on file  Food Insecurity: Not on file  Transportation Needs: Not on file  Physical Activity: Not on file  Stress: Not on file  Social Connections: Not on file  Intimate Partner Violence: Not on file    Review of Systems: See HPI, otherwise negative ROS  Physical Exam: BP (!) 164/82   Pulse (!) 53   Resp (!) 22   Ht 5' 4.02" (1.626 m)   Wt 67.2 kg   SpO2 98%   BMI 25.41 kg/m  General:   Alert, cooperative in NAD Head:  Normocephalic and atraumatic. Respiratory:  Normal work of breathing. Cardiovascular:  RRR  Impression/Plan: Alicia Tate is here for cataract surgery.  Risks, benefits, limitations, and alternatives regarding cataract surgery have been reviewed with the patient.  Questions have been answered.  All parties agreeable.   Galen Manila, MD  04/30/2023, 10:22 AM

## 2023-04-30 NOTE — Op Note (Signed)
PREOPERATIVE DIAGNOSIS:  Nuclear sclerotic cataract of the right eye.   POSTOPERATIVE DIAGNOSIS:  Cataract   OPERATIVE PROCEDURE:ORPROCALL@   SURGEON:  Alicia Manila, MD.   ANESTHESIA:  No anesthesia staff entered.  1.      Managed anesthesia care. 2.      0.28ml of Shugarcaine was instilled in the eye following the paracentesis.   COMPLICATIONS:  None.   TECHNIQUE:   Stop and chop   DESCRIPTION OF PROCEDURE:  The patient was examined and consented in the preoperative holding area where the aforementioned topical anesthesia was applied to the right eye and then brought back to the Operating Room where the right eye was prepped and draped in the usual sterile ophthalmic fashion and a lid speculum was placed. A paracentesis was created with the side port blade and the anterior chamber was filled with viscoelastic. A near clear corneal incision was performed with the steel keratome. A continuous curvilinear capsulorrhexis was performed with a cystotome followed by the capsulorrhexis forceps. Hydrodissection and hydrodelineation were carried out with BSS on a blunt cannula. The lens was removed in a stop and chop  technique and the remaining cortical material was removed with the irrigation-aspiration handpiece. The capsular bag was inflated with viscoelastic and the Technis ZCB00  lens was placed in the capsular bag without complication. The remaining viscoelastic was removed from the eye with the irrigation-aspiration handpiece. The wounds were hydrated. The anterior chamber was flushed with BSS and the eye was inflated to physiologic pressure. 0.78ml of Vigamox was placed in the anterior chamber. The wounds were found to be water tight. The eye was dressed with Combigan. The patient was given protective glasses to wear throughout the day and a shield with which to sleep tonight. The patient was also given drops with which to begin a drop regimen today and will follow-up with me in one  day. Implant Name Type Inv. Item Serial No. Manufacturer Lot No. LRB No. Used Action  LENS IOL TECNIS EYHANCE 20.0 - [id number] Intraocular Lens LENS IOL TECNIS EYHANCE 20.0 [id number] [employer]  Right 1 Implanted   Procedure(s) with comments: CATARACT EXTRACTION PHACO AND INTRAOCULAR LENS PLACEMENT (IOC) RIGHT DIABETIC 9.96 01:00.6 (Right) - Diabetic  Electronically signed: Galen Tate 04/30/2023 10:49 AM

## 2023-04-30 NOTE — Transfer of Care (Signed)
Immediate Anesthesia Transfer of Care Note  Patient: Alicia Tate  Procedure(s) Performed: CATARACT EXTRACTION PHACO AND INTRAOCULAR LENS PLACEMENT (IOC) RIGHT DIABETIC 9.96 01:00.6 (Right: Eye)  Patient Location: PACU  Anesthesia Type: MAC  Level of Consciousness: awake, alert  and patient cooperative  Airway and Oxygen Therapy: Patient Spontanous Breathing and Patient connected to supplemental oxygen  Post-op Assessment: Post-op Vital signs reviewed, Patient's Cardiovascular Status Stable, Respiratory Function Stable, Patent Airway and No signs of Nausea or vomiting  Post-op Vital Signs: Reviewed and stable  Complications: No notable events documented.
